# Patient Record
Sex: Female | Born: 1955 | Race: White | Hispanic: No | Marital: Married | Smoking: Never smoker
Health system: Southern US, Community
[De-identification: ages and names within clinical notes are randomized; demographics above are authoritative.]

## PROBLEM LIST (undated history)

## (undated) DIAGNOSIS — N63 Unspecified lump in unspecified breast: Secondary | ICD-10-CM

---

## 1975-01-20 HISTORY — PX: BREAST BIOPSY: SHX20

## 2013-01-24 ENCOUNTER — Ambulatory Visit: Payer: Self-pay

## 2013-11-15 ENCOUNTER — Ambulatory Visit: Payer: Self-pay | Admitting: Family Medicine

## 2014-03-20 ENCOUNTER — Ambulatory Visit: Payer: Self-pay | Admitting: Family Medicine

## 2014-06-27 ENCOUNTER — Other Ambulatory Visit: Payer: Self-pay | Admitting: Family Medicine

## 2014-06-27 DIAGNOSIS — N83201 Unspecified ovarian cyst, right side: Secondary | ICD-10-CM

## 2014-07-02 ENCOUNTER — Ambulatory Visit
Admission: RE | Admit: 2014-07-02 | Discharge: 2014-07-02 | Disposition: A | Discharge status: Home / Self Care | Payer: No Typology Code available for payment source | Source: Ambulatory Visit | Attending: Family Medicine | Admitting: Family Medicine

## 2014-07-02 DIAGNOSIS — N8320 Unspecified ovarian cysts: Secondary | ICD-10-CM | POA: Insufficient documentation

## 2014-07-02 DIAGNOSIS — N83201 Unspecified ovarian cyst, right side: Secondary | ICD-10-CM

## 2015-02-05 ENCOUNTER — Other Ambulatory Visit: Payer: Self-pay | Admitting: Nurse Practitioner

## 2015-02-05 DIAGNOSIS — N63 Unspecified lump in unspecified breast: Secondary | ICD-10-CM

## 2015-02-21 ENCOUNTER — Ambulatory Visit
Admission: RE | Admit: 2015-02-21 | Discharge: 2015-02-21 | Disposition: A | Discharge status: Home / Self Care | Payer: BLUE CROSS/BLUE SHIELD | Source: Ambulatory Visit | Attending: Nurse Practitioner | Admitting: Nurse Practitioner

## 2015-02-21 ENCOUNTER — Other Ambulatory Visit: Payer: Self-pay | Admitting: Nurse Practitioner

## 2015-02-21 DIAGNOSIS — N63 Unspecified lump in unspecified breast: Secondary | ICD-10-CM

## 2015-02-21 DIAGNOSIS — R928 Other abnormal and inconclusive findings on diagnostic imaging of breast: Secondary | ICD-10-CM | POA: Insufficient documentation

## 2015-02-21 HISTORY — DX: Unspecified lump in unspecified breast: N63.0

## 2016-01-22 ENCOUNTER — Other Ambulatory Visit: Payer: Self-pay | Admitting: Nurse Practitioner

## 2016-01-22 DIAGNOSIS — Z1231 Encounter for screening mammogram for malignant neoplasm of breast: Secondary | ICD-10-CM

## 2016-03-24 ENCOUNTER — Ambulatory Visit
Admission: RE | Admit: 2016-03-24 | Discharge: 2016-03-24 | Disposition: A | Discharge status: Home / Self Care | Payer: BLUE CROSS/BLUE SHIELD | Source: Ambulatory Visit | Attending: Nurse Practitioner | Admitting: Nurse Practitioner

## 2016-03-24 DIAGNOSIS — Z1231 Encounter for screening mammogram for malignant neoplasm of breast: Secondary | ICD-10-CM | POA: Insufficient documentation

## 2016-09-01 ENCOUNTER — Encounter: Payer: Self-pay | Admitting: Emergency Medicine

## 2016-09-01 ENCOUNTER — Emergency Department: Payer: BLUE CROSS/BLUE SHIELD

## 2016-09-01 ENCOUNTER — Emergency Department
Admission: EM | Admit: 2016-09-01 | Discharge: 2016-09-01 | Disposition: A | Discharge status: Home / Self Care | Payer: BLUE CROSS/BLUE SHIELD | Attending: Emergency Medicine | Admitting: Emergency Medicine

## 2016-09-01 DIAGNOSIS — I951 Orthostatic hypotension: Secondary | ICD-10-CM | POA: Diagnosis not present

## 2016-09-01 DIAGNOSIS — R002 Palpitations: Secondary | ICD-10-CM | POA: Diagnosis present

## 2016-09-01 DIAGNOSIS — Z79899 Other long term (current) drug therapy: Secondary | ICD-10-CM | POA: Diagnosis not present

## 2016-09-01 LAB — CBC WITH DIFFERENTIAL/PLATELET
BASOS PCT: 1 %
Basophils Absolute: 0 10*3/uL (ref 0–0.1)
EOS PCT: 1 %
Eosinophils Absolute: 0 10*3/uL (ref 0–0.7)
HEMATOCRIT: 45.2 % (ref 35.0–47.0)
Hemoglobin: 15.5 g/dL (ref 12.0–16.0)
LYMPHS PCT: 22 %
Lymphs Abs: 1.2 10*3/uL (ref 1.0–3.6)
MCH: 31.3 pg (ref 26.0–34.0)
MCHC: 34.2 g/dL (ref 32.0–36.0)
MCV: 91.7 fL (ref 80.0–100.0)
MONO ABS: 0.4 10*3/uL (ref 0.2–0.9)
MONOS PCT: 7 %
NEUTROS ABS: 3.8 10*3/uL (ref 1.4–6.5)
Neutrophils Relative %: 69 %
Platelets: 189 10*3/uL (ref 150–440)
RBC: 4.93 MIL/uL (ref 3.80–5.20)
RDW: 12.4 % (ref 11.5–14.5)
WBC: 5.5 10*3/uL (ref 3.6–11.0)

## 2016-09-01 LAB — URINALYSIS, COMPLETE (UACMP) WITH MICROSCOPIC
Bacteria, UA: NONE SEEN
Bilirubin Urine: NEGATIVE
GLUCOSE, UA: NEGATIVE mg/dL
KETONES UR: 20 mg/dL — AB
Leukocytes, UA: NEGATIVE
NITRITE: NEGATIVE
PH: 6 (ref 5.0–8.0)
Protein, ur: NEGATIVE mg/dL
Specific Gravity, Urine: 1.017 (ref 1.005–1.030)

## 2016-09-01 LAB — COMPREHENSIVE METABOLIC PANEL
ALBUMIN: 4.2 g/dL (ref 3.5–5.0)
ALT: 18 U/L (ref 14–54)
AST: 24 U/L (ref 15–41)
Alkaline Phosphatase: 105 U/L (ref 38–126)
Anion gap: 8 (ref 5–15)
BILIRUBIN TOTAL: 1 mg/dL (ref 0.3–1.2)
BUN: 16 mg/dL (ref 6–20)
CO2: 24 mmol/L (ref 22–32)
Calcium: 9.8 mg/dL (ref 8.9–10.3)
Chloride: 107 mmol/L (ref 101–111)
Creatinine, Ser: 0.97 mg/dL (ref 0.44–1.00)
GFR calc Af Amer: >60 mL/min (ref >60)
GFR calc non Af Amer: >60 mL/min (ref >60)
GLUCOSE: 129 mg/dL — AB (ref 65–99)
Potassium: 3.7 mmol/L (ref 3.5–5.1)
Sodium: 139 mmol/L (ref 135–145)
TOTAL PROTEIN: 7.7 g/dL (ref 6.5–8.1)

## 2016-09-01 LAB — TSH: TSH: 4.302 u[IU]/mL (ref 0.350–4.500)

## 2016-09-01 LAB — LIPASE, BLOOD: Lipase: 31 U/L (ref 11–51)

## 2016-09-01 LAB — FIBRIN DERIVATIVES D-DIMER (ARMC ONLY): Fibrin derivatives D-dimer (ARMC): 304.47 (ref 0.00–499.00)

## 2016-09-01 LAB — TROPONIN I

## 2016-09-01 MED ORDER — SODIUM CHLORIDE 0.9 % IV BOLUS (SEPSIS)
1000.0000 mL | Freq: Once | INTRAVENOUS | Status: AC
  Administered 2016-09-01: 1000 mL via INTRAVENOUS

## 2016-09-01 NOTE — ED Provider Notes (Signed)
[facility]-EMERGENCY DEPARTMENT Provider Note   CSN: [id number] Arrival date & time: 09/01/16  0901     History   Chief Complaint Chief Complaint  Patient presents with  . Palpitations  . Loss of Consciousness    HPI Alexis Camacho is a 61 y.o. female otherwise healthy here presenting with palpitations, syncope. Patient states that she has intermittent palpitations for several months well since yesterday her palpitations has been more constant. Patient states that she felt light headed and dizzy. This morning, she stood up and passed out and woke up on the floor. Denies any headaches or vomiting afterwards. Not on blood thinners. Denies hx of CAD or PE or DVT or recent travel. Went to PCP and sent for evaluation.   The history is provided by the patient.    History reviewed. No pertinent past medical history.  There are no active problems to display for this patient.   Past Surgical History:  Procedure Laterality Date  . BREAST BIOPSY Right 1977   neg    OB History    No data available       Home Medications    Prior to Admission medications   Medication Sig Start Date End Date Taking? Authorizing Provider  azelastine (ASTELIN) 0.1 % nasal spray Place 2 sprays into both nostrils 2 (two) times daily. 08/27/16  Yes [provider]  Calcium Carbonate-Vit D-Min ([employer] CALCIUM 600 + D/MINERALS PO) Take 1 tablet by mouth daily.   Yes [provider]  cetirizine (ZYRTEC) 10 MG tablet Take 10 mg by mouth daily.   Yes [provider]  Cholecalciferol (VITAMIN D3) 2000 units capsule Take 4,000 Units by mouth daily.   Yes [provider]    Family History Family History  Problem Relation Age of Onset  . Breast cancer Cousin 42       pat cousin    Social History Social History  Substance Use Topics  . Smoking status: Never Smoker  . Smokeless tobacco: Never Used  . Alcohol use Yes     Allergies   Patient has no known  allergies.   Review of Systems Review of Systems  Cardiovascular: Positive for palpitations and syncope.  All other systems reviewed and are negative.    Physical Exam Updated Vital Signs BP (!) 147/88   Pulse 65   Temp 98.3 F (36.8 C) (Oral)   Resp 12   Ht 5' (1.524 m)   Wt 62.1 kg (137 lb)   SpO2 99%   BMI 26.76 kg/m   Physical Exam  Constitutional: She is oriented to person, place, and time. She appears well-developed.  HENT:  Head: Normocephalic.  Mouth/Throat: Oropharynx is clear and moist.  No obvious scalp hematoma   Eyes: Pupils are equal, round, and reactive to light. Conjunctivae and EOM are normal.  Neck: Normal range of motion.  Cardiovascular:  Slightly tachycardic   Pulmonary/Chest: Effort normal and breath sounds normal. No respiratory distress. She has no wheezes.  Abdominal: Soft. Bowel sounds are normal. She exhibits no distension. There is no tenderness.  Musculoskeletal: Normal range of motion. She exhibits no edema or tenderness.  Neurological: She is alert and oriented to person, place, and time. No cranial nerve deficit. Coordination normal.  Skin: Skin is warm.  Psychiatric: She has a normal mood and affect.  Nursing note and vitals reviewed.    ED Treatments / Results  Labs (all labs ordered are listed, but only abnormal results are displayed) Labs Reviewed  URINALYSIS,  COMPLETE (UACMP) WITH MICROSCOPIC - Abnormal; Notable for the following:       Result Value   Color, Urine YELLOW (*)    APPearance CLEAR (*)    Hgb urine dipstick SMALL (*)    Ketones, ur 20 (*)    Squamous Epithelial / LPF 0-5 (*)    All other components within normal limits  COMPREHENSIVE METABOLIC PANEL - Abnormal; Notable for the following:    Glucose, Bld 129 (*)    All other components within normal limits  TROPONIN I  CBC WITH DIFFERENTIAL/PLATELET  FIBRIN DERIVATIVES D-DIMER ([facility] ONLY)  TSH  LIPASE, BLOOD    EKG  EKG  Interpretation  Date/Time:  Tuesday September 01 2016 09:03:47 EDT Ventricular Rate:  96 PR Interval:  128 QRS Duration: 80 QT Interval:  340 QTC Calculation: 429 R Axis:   -33 Text Interpretation:  Normal sinus rhythm Left axis deviation Possible Anterior infarct , age undetermined Abnormal ECG No previous ECGs available No previous ECGs available Confirmed by Richardean CanalYao, Adian Jablonowski H ([id number]) on 09/01/2016 9:16:19 AM       Radiology Dg Chest 2 View  Result Date: 09/01/2016 CLINICAL DATA:  Palpitations.  Syncope this morning. EXAM: CHEST  2 VIEW COMPARISON:  None. FINDINGS: The heart size and mediastinal contours are within normal limits. Both lungs are clear. The visualized skeletal structures are unremarkable. IMPRESSION: Normal chest. Electronically Signed   By: Francene BoyersJames  Maxwell M.D.   On: 09/01/2016 09:32    Procedures Procedures (including critical care time)  Medications Ordered in ED Medications  sodium chloride 0.9 % bolus 1,000 mL (1,000 mLs Intravenous New Bag/Given 09/01/16 0950)     Initial Impression / Assessment and Plan / ED Course  I have reviewed the triage vital signs and the nursing notes.  Pertinent labs & imaging results that were available during my care of the patient were reviewed by me and considered in my medical decision making (see chart for details).     Alexis Camacho is a 61 y.o. female here with palpitations, dizziness, syncope. Consider vasovagal vs arrhythmias, low risk for PE or ACS. Nl neuro exam now. Will get labs, D-dimer, TSH. Will get orthostatic and reassess.   11:54 AM UA showed some ketones. Mildly orthostatic initially. HR down to 70s after NS 1 L bolus. Trop neg. CXR unremarkable. TSH normal. Likely mild orthostasis. Told her to stay hydrated.    Final Clinical Impressions(s) / ED Diagnoses   Final diagnoses:  None    New Prescriptions New Prescriptions   No medications on file     Charlynne PanderYao, Lynette Topete Hsienta, MD 09/01/16 1155

## 2016-09-01 NOTE — ED Notes (Signed)
OT taken to lobby via  Wheelchair. Pt sitting in lobby, first nurse aware. Pt is waiting for ride to arrive. Pt in NAD and denies feeling dizzy, lightheaded, or weak at this time.

## 2016-09-01 NOTE — Discharge Instructions (Signed)
You are mildly dehydrated. Stay hydrated. Eat and drink normally.   See your doctor. If you have persistent palpitations, consider Holter monitor   Return to ER if you have worse palpitations, chest pain, trouble breathing, passing out.

## 2016-09-01 NOTE — ED Triage Notes (Signed)
Pt c/o palpitations on and off since yesterday.  Has had some SHOB when laying down at night. Unlabored currently. Has been dizzy with this.  Syncope this AM after got out of bed.  Skin warm dry and pink. Has had some pressure in chest but denies at this time.

## 2016-09-01 NOTE — ED Notes (Signed)
MD at bedside to update pt

## 2017-01-29 ENCOUNTER — Other Ambulatory Visit: Payer: Self-pay | Admitting: Nurse Practitioner

## 2017-01-29 DIAGNOSIS — Z1231 Encounter for screening mammogram for malignant neoplasm of breast: Secondary | ICD-10-CM

## 2017-03-29 ENCOUNTER — Ambulatory Visit
Admission: RE | Admit: 2017-03-29 | Discharge: 2017-03-29 | Disposition: A | Discharge status: Home / Self Care | Payer: BLUE CROSS/BLUE SHIELD | Source: Ambulatory Visit | Attending: Nurse Practitioner | Admitting: Nurse Practitioner

## 2017-03-29 DIAGNOSIS — Z1231 Encounter for screening mammogram for malignant neoplasm of breast: Secondary | ICD-10-CM | POA: Insufficient documentation

## 2018-02-03 ENCOUNTER — Other Ambulatory Visit: Payer: Self-pay | Admitting: Nurse Practitioner

## 2018-02-03 DIAGNOSIS — Z1231 Encounter for screening mammogram for malignant neoplasm of breast: Secondary | ICD-10-CM

## 2018-04-12 ENCOUNTER — Ambulatory Visit: Payer: No Typology Code available for payment source

## 2018-05-23 ENCOUNTER — Ambulatory Visit: Payer: No Typology Code available for payment source

## 2018-08-03 ENCOUNTER — Ambulatory Visit
Admission: RE | Admit: 2018-08-03 | Discharge: 2018-08-03 | Disposition: A | Discharge status: Home / Self Care | Payer: BC Managed Care – PPO | Source: Ambulatory Visit | Attending: Nurse Practitioner | Admitting: Nurse Practitioner

## 2018-08-03 ENCOUNTER — Other Ambulatory Visit: Payer: Self-pay

## 2018-08-03 DIAGNOSIS — Z1231 Encounter for screening mammogram for malignant neoplasm of breast: Secondary | ICD-10-CM | POA: Diagnosis present

## 2018-08-05 ENCOUNTER — Other Ambulatory Visit: Payer: Self-pay | Admitting: Nurse Practitioner

## 2018-08-05 DIAGNOSIS — N6489 Other specified disorders of breast: Secondary | ICD-10-CM

## 2018-08-05 DIAGNOSIS — R928 Other abnormal and inconclusive findings on diagnostic imaging of breast: Secondary | ICD-10-CM

## 2018-08-15 ENCOUNTER — Other Ambulatory Visit: Payer: BC Managed Care – PPO

## 2018-08-15 ENCOUNTER — Ambulatory Visit: Payer: BC Managed Care – PPO | Attending: Nurse Practitioner

## 2018-08-22 ENCOUNTER — Other Ambulatory Visit: Payer: Self-pay

## 2018-08-22 ENCOUNTER — Ambulatory Visit
Admission: RE | Admit: 2018-08-22 | Discharge: 2018-08-22 | Disposition: A | Discharge status: Home / Self Care | Payer: BC Managed Care – PPO | Source: Ambulatory Visit | Attending: Nurse Practitioner | Admitting: Nurse Practitioner

## 2018-08-22 DIAGNOSIS — R928 Other abnormal and inconclusive findings on diagnostic imaging of breast: Secondary | ICD-10-CM | POA: Diagnosis not present

## 2018-08-22 DIAGNOSIS — N6489 Other specified disorders of breast: Secondary | ICD-10-CM | POA: Insufficient documentation

## 2020-11-10 IMAGING — MG DIGITAL SCREENING BILATERAL MAMMOGRAM WITH TOMO AND CAD
8 series · 8 of 24 positions shown · non-contrast
Comparison: Previous exam(s).

CLINICAL DATA: Screening.

EXAM:
DIGITAL SCREENING BILATERAL MAMMOGRAM WITH TOMO AND CAD

[L MLO synth-2D]
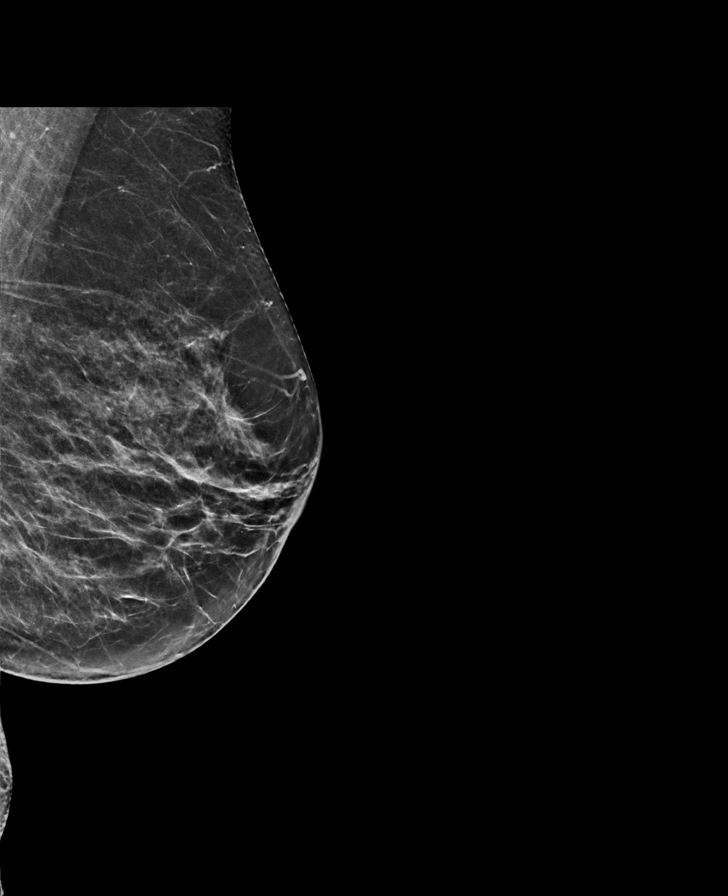

[R MLO synth-2D]
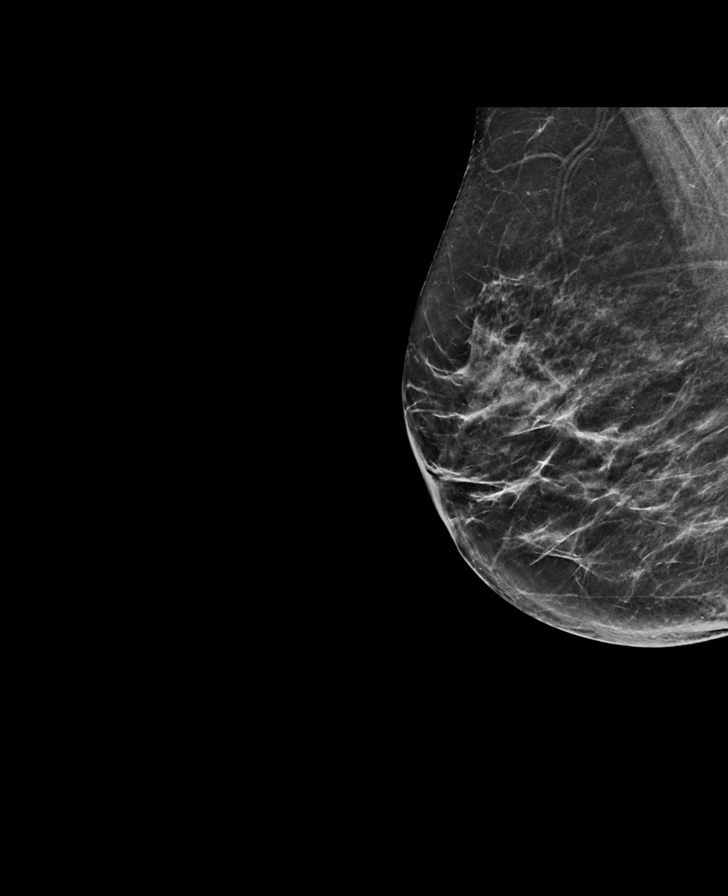

[L CC synth-2D]
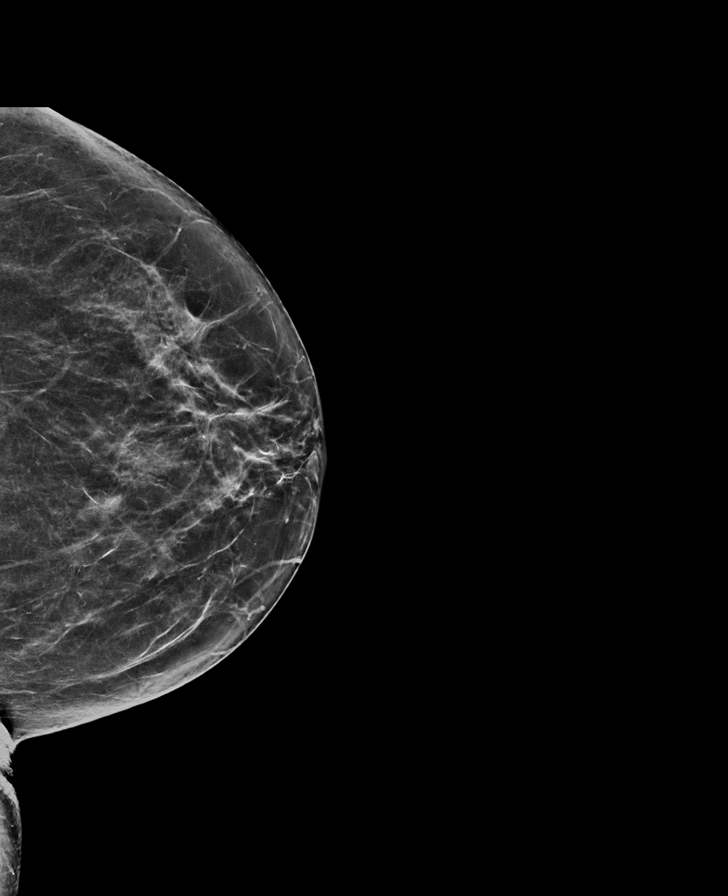

[R CC synth-2D]
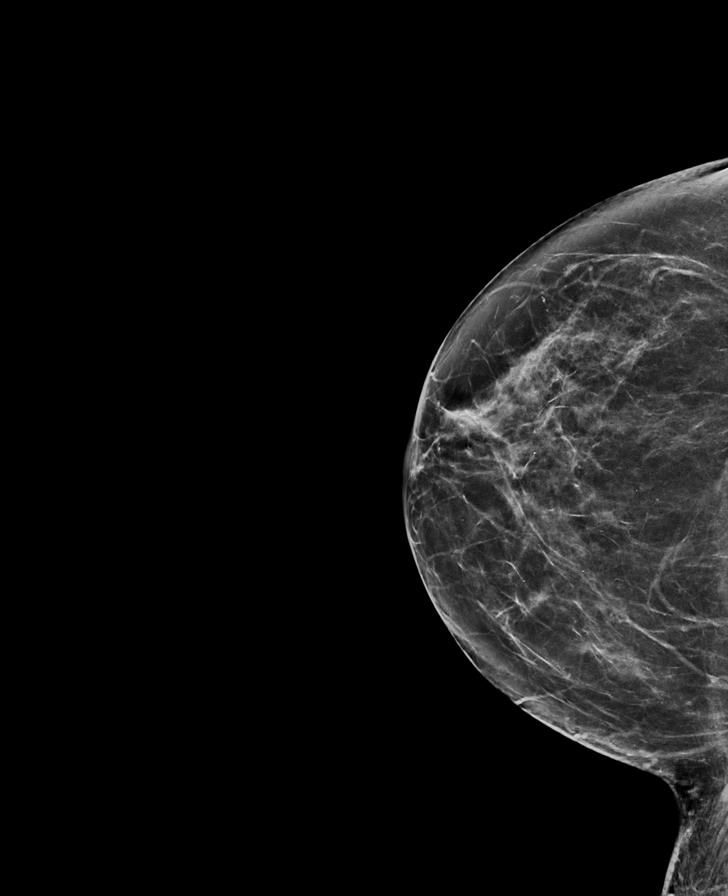

[R CC tomo · tomo slice 33/66.0]
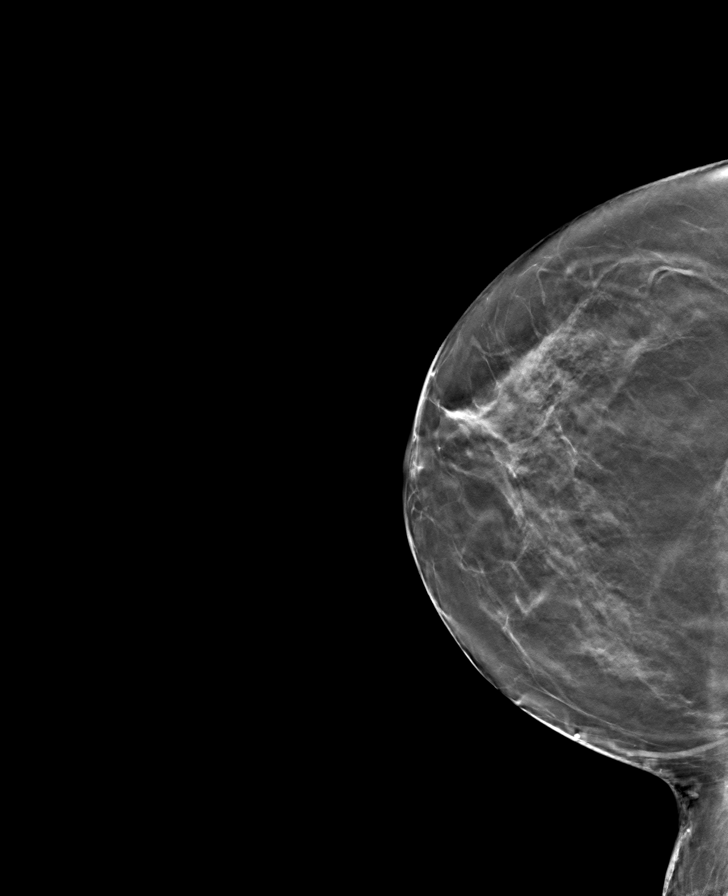

[L CC tomo · tomo slice 32/63.0]
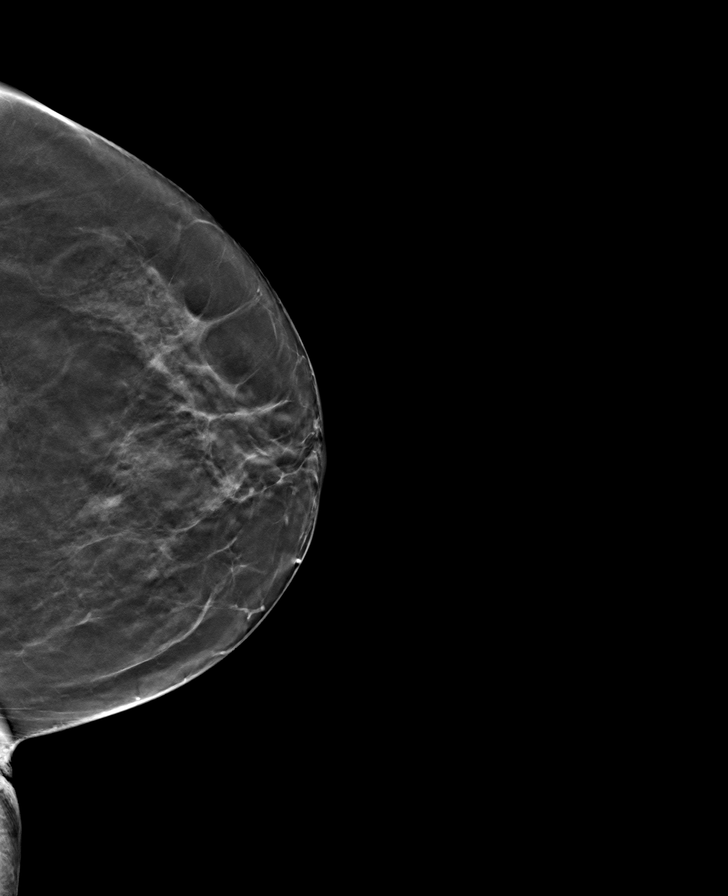

[R MLO tomo · tomo slice 33/64.0]
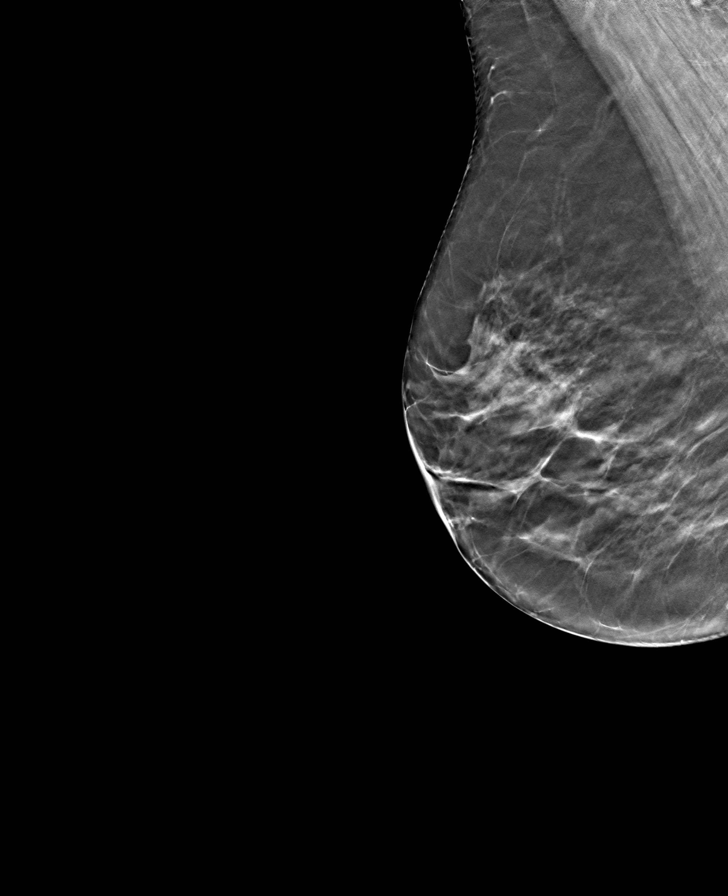

[L MLO tomo · tomo slice 31/62.0]
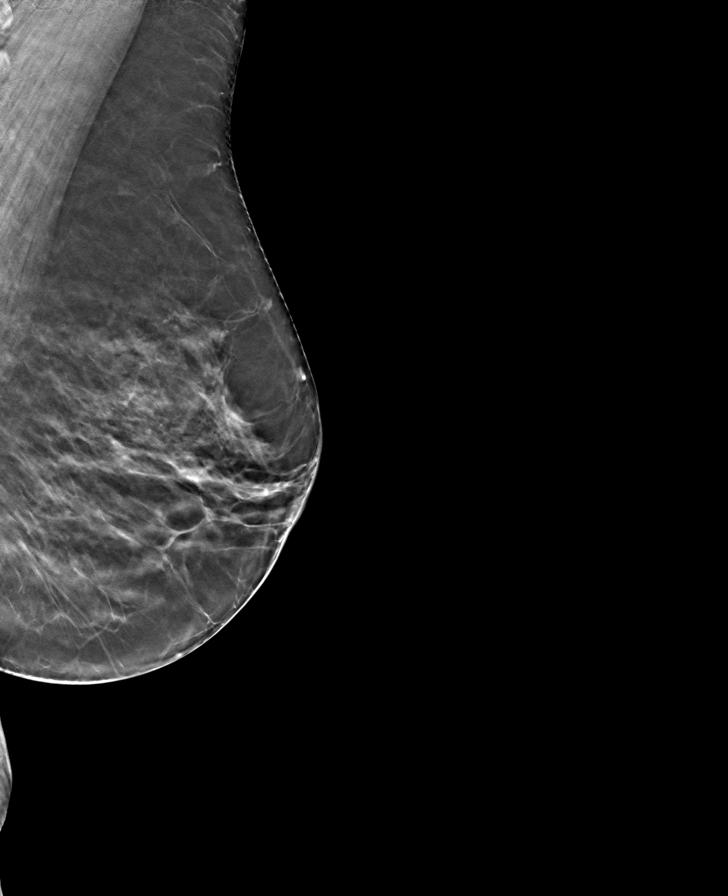

[8 of 24 positions shown; findings below may reference images not displayed]

ACR Breast Density Category b: There are scattered areas of
fibroglandular density.
FINDINGS: In the left breast, a possible asymmetry warrants further
evaluation. In the right breast, no findings suspicious for
malignancy. Images were processed with CAD.
IMPRESSION: Further evaluation is suggested for possible asymmetry in the left
breast.

RECOMMENDATION:
Diagnostic mammogram and possibly ultrasound of the left breast.
(Code:DI-5-LL4)

The patient will be contacted regarding the findings, and additional
imaging will be scheduled.

BI-RADS CATEGORY  0: Incomplete. Need additional imaging evaluation
and/or prior mammograms for comparison.
